# Patient Record
Sex: Female | Born: 1980 | Race: White | Hispanic: No | Marital: Married | Smoking: Current every day smoker
Health system: Southern US, Community
[De-identification: ages and names within clinical notes are randomized; demographics above are authoritative.]

## PROBLEM LIST (undated history)

## (undated) DIAGNOSIS — M1990 Unspecified osteoarthritis, unspecified site: Secondary | ICD-10-CM

## (undated) DIAGNOSIS — E079 Disorder of thyroid, unspecified: Secondary | ICD-10-CM

---

## 2001-05-08 ENCOUNTER — Emergency Department (HOSPITAL_COMMUNITY): Admission: EM | Admit: 2001-05-08 | Discharge: 2001-05-08 | Payer: Self-pay | Admitting: Emergency Medicine

## 2001-07-20 ENCOUNTER — Emergency Department (HOSPITAL_COMMUNITY): Admission: EM | Admit: 2001-07-20 | Discharge: 2001-07-20 | Payer: Self-pay | Admitting: Emergency Medicine

## 2001-12-27 ENCOUNTER — Emergency Department (HOSPITAL_COMMUNITY): Admission: EM | Admit: 2001-12-27 | Discharge: 2001-12-27 | Payer: Self-pay | Admitting: Emergency Medicine

## 2003-06-12 ENCOUNTER — Emergency Department (HOSPITAL_COMMUNITY): Admission: EM | Admit: 2003-06-12 | Discharge: 2003-06-12 | Payer: Self-pay | Admitting: Emergency Medicine

## 2003-07-18 ENCOUNTER — Ambulatory Visit (HOSPITAL_COMMUNITY): Admission: RE | Admit: 2003-07-18 | Discharge: 2003-07-18 | Payer: Self-pay | Admitting: *Deleted

## 2003-12-09 ENCOUNTER — Inpatient Hospital Stay (HOSPITAL_COMMUNITY): Admission: RE | Admit: 2003-12-09 | Discharge: 2003-12-12 | Payer: Self-pay | Admitting: *Deleted

## 2005-01-31 ENCOUNTER — Emergency Department (HOSPITAL_COMMUNITY): Admission: EM | Admit: 2005-01-31 | Discharge: 2005-01-31 | Payer: Self-pay | Admitting: Emergency Medicine

## 2005-05-13 ENCOUNTER — Ambulatory Visit: Payer: Self-pay | Admitting: Family Medicine

## 2005-05-14 ENCOUNTER — Ambulatory Visit (HOSPITAL_COMMUNITY): Admission: RE | Admit: 2005-05-14 | Discharge: 2005-05-14 | Payer: Self-pay | Admitting: Family Medicine

## 2005-06-06 ENCOUNTER — Emergency Department (HOSPITAL_COMMUNITY): Admission: EM | Admit: 2005-06-06 | Discharge: 2005-06-06 | Payer: Self-pay | Admitting: Emergency Medicine

## 2005-06-17 ENCOUNTER — Ambulatory Visit: Payer: Self-pay | Admitting: Family Medicine

## 2005-10-13 ENCOUNTER — Ambulatory Visit: Payer: Self-pay | Admitting: Family Medicine

## 2005-12-16 ENCOUNTER — Ambulatory Visit: Payer: Self-pay | Admitting: Family Medicine

## 2005-12-22 ENCOUNTER — Ambulatory Visit: Payer: Self-pay | Admitting: Family Medicine

## 2006-01-12 ENCOUNTER — Emergency Department (HOSPITAL_COMMUNITY): Admission: EM | Admit: 2006-01-12 | Discharge: 2006-01-12 | Payer: Self-pay | Admitting: Emergency Medicine

## 2006-01-24 ENCOUNTER — Encounter: Payer: Self-pay | Admitting: Family Medicine

## 2006-01-24 ENCOUNTER — Other Ambulatory Visit: Admission: RE | Admit: 2006-01-24 | Discharge: 2006-01-24 | Payer: Self-pay | Admitting: Family Medicine

## 2006-01-24 ENCOUNTER — Ambulatory Visit: Payer: Self-pay | Admitting: Family Medicine

## 2006-01-24 LAB — CONVERTED CEMR LAB: Pap Smear: NORMAL

## 2006-04-14 ENCOUNTER — Ambulatory Visit: Payer: Self-pay | Admitting: Family Medicine

## 2006-05-07 ENCOUNTER — Emergency Department (HOSPITAL_COMMUNITY): Admission: EM | Admit: 2006-05-07 | Discharge: 2006-05-08 | Payer: Self-pay | Admitting: Emergency Medicine

## 2006-05-08 ENCOUNTER — Emergency Department (HOSPITAL_COMMUNITY): Admission: EM | Admit: 2006-05-08 | Discharge: 2006-05-08 | Payer: Self-pay | Admitting: Emergency Medicine

## 2006-05-17 ENCOUNTER — Ambulatory Visit: Payer: Self-pay | Admitting: Family Medicine

## 2006-06-08 ENCOUNTER — Emergency Department (HOSPITAL_COMMUNITY): Admission: EM | Admit: 2006-06-08 | Discharge: 2006-06-08 | Payer: Self-pay | Admitting: Emergency Medicine

## 2006-06-09 ENCOUNTER — Emergency Department (HOSPITAL_COMMUNITY): Admission: EM | Admit: 2006-06-09 | Discharge: 2006-06-09 | Payer: Self-pay | Admitting: Emergency Medicine

## 2006-08-10 ENCOUNTER — Emergency Department (HOSPITAL_COMMUNITY): Admission: EM | Admit: 2006-08-10 | Discharge: 2006-08-10 | Payer: Self-pay | Admitting: Emergency Medicine

## 2006-08-10 ENCOUNTER — Ambulatory Visit: Payer: Self-pay | Admitting: Family Medicine

## 2007-02-06 ENCOUNTER — Emergency Department (HOSPITAL_COMMUNITY): Admission: EM | Admit: 2007-02-06 | Discharge: 2007-02-06 | Payer: Self-pay | Admitting: *Deleted

## 2007-03-09 ENCOUNTER — Encounter: Payer: Self-pay | Admitting: Family Medicine

## 2007-04-11 ENCOUNTER — Emergency Department (HOSPITAL_COMMUNITY): Admission: EM | Admit: 2007-04-11 | Discharge: 2007-04-11 | Payer: Self-pay | Admitting: Emergency Medicine

## 2007-04-17 ENCOUNTER — Encounter: Payer: Self-pay | Admitting: Family Medicine

## 2007-04-17 DIAGNOSIS — F329 Major depressive disorder, single episode, unspecified: Secondary | ICD-10-CM

## 2007-04-17 DIAGNOSIS — J45909 Unspecified asthma, uncomplicated: Secondary | ICD-10-CM | POA: Insufficient documentation

## 2007-04-17 DIAGNOSIS — L02519 Cutaneous abscess of unspecified hand: Secondary | ICD-10-CM

## 2007-04-17 DIAGNOSIS — L03019 Cellulitis of unspecified finger: Secondary | ICD-10-CM

## 2007-04-17 DIAGNOSIS — E669 Obesity, unspecified: Secondary | ICD-10-CM

## 2008-02-08 ENCOUNTER — Emergency Department (HOSPITAL_COMMUNITY): Admission: EM | Admit: 2008-02-08 | Discharge: 2008-02-08 | Payer: Self-pay | Admitting: Emergency Medicine

## 2009-04-26 ENCOUNTER — Emergency Department (HOSPITAL_COMMUNITY): Admission: EM | Admit: 2009-04-26 | Discharge: 2009-04-27 | Payer: Self-pay | Admitting: Emergency Medicine

## 2009-08-18 ENCOUNTER — Emergency Department (HOSPITAL_COMMUNITY): Admission: EM | Admit: 2009-08-18 | Discharge: 2009-08-18 | Payer: Self-pay | Admitting: Emergency Medicine

## 2010-04-07 NOTE — Letter (Signed)
Summary: RPC chart  RPC chart   Imported By: Curtis Sites 09/25/2009 12:52:44  _____________________________________________________________________  External Attachment:    Type:   Image     Comment:   External Document

## 2010-04-19 ENCOUNTER — Emergency Department: Payer: Self-pay | Admitting: Emergency Medicine

## 2010-05-22 ENCOUNTER — Emergency Department (HOSPITAL_COMMUNITY)
Admission: EM | Admit: 2010-05-22 | Discharge: 2010-05-22 | Disposition: A | Discharge status: Home / Self Care | Payer: Medicaid Other | Attending: Emergency Medicine | Admitting: Emergency Medicine

## 2010-05-22 DIAGNOSIS — K2900 Acute gastritis without bleeding: Secondary | ICD-10-CM | POA: Insufficient documentation

## 2010-05-22 DIAGNOSIS — Z331 Pregnant state, incidental: Secondary | ICD-10-CM | POA: Insufficient documentation

## 2010-05-22 DIAGNOSIS — R109 Unspecified abdominal pain: Secondary | ICD-10-CM | POA: Insufficient documentation

## 2010-05-22 LAB — URINALYSIS, ROUTINE W REFLEX MICROSCOPIC
Bilirubin Urine: NEGATIVE
Leukocytes, UA: NEGATIVE
Nitrite: NEGATIVE
Specific Gravity, Urine: 1.015 (ref 1.005–1.030)
Urobilinogen, UA: 0.2 mg/dL (ref 0.0–1.0)
pH: 7 (ref 5.0–8.0)

## 2010-05-22 LAB — URINE MICROSCOPIC-ADD ON

## 2010-05-23 LAB — URINE CULTURE: Colony Count: 4000

## 2010-05-27 LAB — RAPID STREP SCREEN (MED CTR MEBANE ONLY): Streptococcus, Group A Screen (Direct): POSITIVE — AB

## 2010-07-24 NOTE — Discharge Summary (Signed)
NAME:  [name], Lauren Parks               ACCOUNT NO.:  [id number]   MEDICAL RECORD NO.:  [id number]          PATIENT TYPE:  INP   LOCATION:  A412                          FACILITY:  [facility]   PHYSICIAN:  Langley Gauss, M.D.   DATE OF BIRTH:  1980-10-07   DATE OF ADMISSION:  12/09/2003  DATE OF DISCHARGE:  10/06/2005LH                                 DISCHARGE SUMMARY   PROCEDURE:  Primary low-transverse cesarean section December 09, 2003.  Delivery of a 4 pound, 14 ounce female infant.   POSTOPERATIVE DIAGNOSES:  1.  Anemia secondary to blood loss.  2.  Postoperative ileus extending the hospital stay to October 6.   HOSPITAL COURSE:  See previous dictation.  The patient presented on December 09, 2003, 38+ weeks' gestation, after an uncomplicated pregnancy.  The  patient was noted to have variable decelerations during the immediate  observation period.  Thus, preparations are made to proceed with emergent,  primary low-transverse cesarean section.  This was performed without  complications.  Noted at the time of delivery is green, thick meconium  amniotic fluid as well as a nuchal cord x1.  The patient had a Foley  catheter in place, and a JP drain within the subcutaneous space.  She did  very well pain relief, receiving IV Buprenex.   On postoperative day #1, Foley catheter was removed.  The patient  subsequently was fully ambulatory.  Vital signs remained stable.  She had  good urine output.  JP drain continued to drain.   On postoperative day #2, the patient began having some gas pains but had not  passed any flatus.  She again increased her ambulation, voided without  difficulty.  JP drain was aspirated with fluid noted to be clearing.  Abdomen was soft and nontender.  The patient was given a Dulcolax  suppository.   On postoperative day #3, the patient at that time had the JP drain removed.  The incision was examined and noted to well approximated, with minimal  erythema and no  significant induration noted.   FOLLOWUP:  Thus, the patient was discharged home on December 12, 2003, with  instructions to follow up in the office in four days' time for staple  removal.   DISCHARGE INSTRUCTIONS:  She was given a copy of standardized discharge  instructions at the time of discharge.  The retention sutures were also  removed at the time of discharge.   DISCHARGE MEDICATIONS:  Tylox #30 with no refill.   LABORATORY DATA:  Admission hemoglobin and hematocrit 13.3/39.3 with a white  count of 9.0.  On postoperative day #1, hemoglobin 11.3, hematocrit 32.0  with a white count of 10.5.  Postoperative day #3, hemoglobin had  equilibrated to 9.6/28.1 with a white count of 6.0.   INFANT DISCHARGE CONDITION:  The patient is bottle feeding at the time of  discharge.  Pediatric care by Dr. Ara Kussmaul.      DC/MEDQ  D:  12/15/2003  T:  12/15/2003  Job:  [id number]   cc:   Francoise Schaumann. Halm, D.O.  [address]    [place] [address]  Fax: [phone]

## 2010-07-24 NOTE — H&P (Signed)
NAME:  CAMRY, ROBELLO               ACCOUNT NO.:  [id number]   MEDICAL RECORD NO.:  [id number]          PATIENT TYPE:  INP   LOCATION:  LDR4                          FACILITY:  [facility]   PHYSICIAN:  Langley Gauss, M.D.   DATE OF BIRTH:  [birth year]   DATE OF ADMISSION:  12/09/2003  DATE OF DISCHARGE:  LH                                HISTORY & PHYSICAL   A 30 year old gravida 1, para 0, at 38+ weeks' gestation, who presents at  0900 complaining of uterine contractions onset 0600.  Upon initial  presentation, the patient is noted to have fetal heart rate decelerations to  60-70 beats per minute x30 seconds' duration with each uterine contraction.  The patient's prenatal course has been uncomplicated.  She did have an  abnormal one-hour GTT, but a three-hour GTT was within normal limits.  She  has had appropriate fundal height growth and a normal anatomic survey on  previous ultrasonographic study.   PAST MEDICAL HISTORY:  She was evaluated during the pregnancy for signs and  symptoms of hypoglycemia.  She was seen by Dr. Gerilyn Pilgrim.  Workup was  essentially negative.  The patient did well with changing her diet to avoid  sugar-containing foods and liquids.   CURRENT MEDICATIONS:  The patient does have a history of asthma.  During the  pregnancy she has used albuterol and Flovent inhalers as well as Allegra D  on a p.r.n. basis.   She has no known drug allergies.   PHYSICAL EXAMINATION:  VITAL SIGNS:  Height 5 feet 1-1/2 inches, blood  pressure 145/90, pulse 80, respiratory rate 20.  HEENT:  Negative.  No adenopathy.  NECK:  Supple.  Thyroid is nonpalpable.  CHEST:  Lungs clear.  CARDIOVASCULAR:  Regular rate and rhythm.  ABDOMEN:  Soft and nontender.  No surgical scars were identified.  Vertex  presentation by Leopold's maneuvers.  Fundal height is 36 cm.  Fetal heart  tones auscultated in the 150s.  EXTREMITIES:  Noted to be normal.  PELVIC:  Normal external genitalia.  No lesions  or ulcerations.  No vaginal  bleeding, no leakage of fluid.  Cervix 1 cm dilated, posterior, minimally  effaced.   LABORATORY DATA:  Her group B strep is negative.  Three-hour GTT as stated  previously, normal.   External fetal monitor reveals normal long-term variability, but there are  the fetal heart decelerations described previously, which undoubtedly  represent cord compression associated with uterine contractions.  The  patient is treated with 0.25 mg of subcu terbutaline to result in cessation  of uterine activity, and the decision is made to proceed with emergent low  transverse cesarean section.  Following administration of terbutaline and  cessation of uterine activity, there was no further fetal heart  decelerations.  However, as they would be expected to recur with renewed  uterine activity, a decision has been made to proceed with primary low  transverse cesarean section with no  fetal heart rate decelerations present at this time.  The patient can  probably be treated with a spinal analgesic.  Called the O.R.  Cases  are  currently in process.  Expectation is a room will be available within 30  minutes with the status of the fetal heart rate at present with no further  heart rate decelerations.  This is a viable option.      DC/MEDQ  D:  12/09/2003  T:  12/09/2003  Job:  [id number]

## 2010-07-24 NOTE — Op Note (Signed)
NAME:  Lauren, Parks               ACCOUNT NO.:  [id number]   MEDICAL RECORD NO.:  [id number]          PATIENT TYPE:  INP   LOCATION:  LDR4                          FACILITY:  [facility]   PHYSICIAN:  Langley Gauss, M.D.   DATE OF BIRTH:  04-Nov-1980   DATE OF PROCEDURE:  12/09/2003  DATE OF DISCHARGE:                                 OPERATIVE REPORT   PREOPERATIVE DIAGNOSES:  1.  Thirty-eight plus week intrauterine pregnancy.  2.  Moderate fetal heart rate variable-type decelerations.  3.  Intrauterine growth restriction.   PROCEDURE PERFORMED:  Emergent primary low transverse cesarean section,  delivery of a 4 pound 11 ounce female infant.   SURGEON:  Langley Gauss, M.D.   SPECIMENS:  Arterial cord gas and cord blood are obtained and sent to the  laboratory.  The placenta is likewise sent to pathology.  It is noted to be  a small placenta.   ESTIMATED BLOOD LOSS:  800 mL.   ANALGESIC:  Spinal.   PEDIATRICIAN:  Francoise Schaumann. Halm, D.O.   DRAINS:  Foley catheter to straight drainage, a JP drain is placed in the  subcutaneous space.   Findings at time of surgery include green, meconium-stained amniotic fluid.  Also noted is a nuchal cord x1 with thin umbilical cord with decreased  Wharton's jelly identified.   SUMMARY:  See previous H&P.  The patient was noted to have no further fetal  heart rate decelerations with complete cessation of uterine activity.  She  was thus taken to the operating room, where placed in a seated position a  spinal analgesic was administered without complications.  The patient was  then returned to the operating room table with a left lateral tilt, prepped  and draped in the usual sterile manner, a Foley catheter then placed on the  fourth floor.  After assurance of adequate surgical analgesia, a knife is  used to create a Pfannenstiel incision.  Multiple subcutaneous bleeders were  cauterized to achieve hemostasis, dissected down to the fascial plane.   The  fascia is then incised in a transverse curvilinear manner while sharply  dissected off the underlying rectus muscle in the avascular plane.  The  fascial edges are grasped using straight Kocher clamps.  The fascia is  dissected off the underlying rectus muscle in the midline utilizing the Mayo  scissors.  The rectus muscles were then bluntly separated.  The peritoneal  cavity is entered by elevating the peritoneum with an Allis clamp and then  sharp and fine dissection with the Mayo scissors to atraumatically enter the  peritoneal cavity.  Peritoneal incision is then extended superiorly and  inferiorly and freely palpated to visualize the bladder to avoid its  accidental entry.  The bladder blade is then placed, the lower uterine  segment is identified, and a bladder flap is created from the vesicouterine  fold in the avascular plane.  This is bluntly pushed down toward the cervix.  The low transverse uterine incision is thus scored with a knife, intact  amniotic sac is encountered in the midline.  Amniotic sac is artificially  ruptured with an Allis clamp with the findings of the green, meconium-  stained amniotic fluid.  My index fingers are then used to bluntly extend  the uterine incision bilaterally.  Infant is noted to be in an LOA position.  The head of the infant is flexed and elevated to the level of the uterine  incision.  The disposable suction connected to wall suction is then placed  on the infant's vertex.  Gentle traction combined with fundal pressure  results in very easy delivery of the head through the uterine incision.  Mouth and nares were aggressively bulb-suctioned of the meconium-stained  amniotic fluid.  A nuchal cord x1 is reduced.  Renewed fundal pressure plus  gentle traction results in delivery of the anterior right shoulder without  difficulty as well as the remainder of the infant without difficulty.  The  umbilical cord is then milked toward the infant,  the cord is doubly clamped  and cut, and the infant is taken to the waiting pediatrician, Francoise Schaumann.  Halm, D.O.  Arterial cord gas and cord blood are then obtained from the  umbilical cord.  Gentle traction on the umbilical cord results in a  separation, which upon examination appears to be an intact placenta with  associated three-vessel umbilical cord.  The uterus is then exteriorized.  Intrauterine exploration reveals no retained placental fragments.  The  uterine is not extended.  It is easily closed with 0 chromic in a running  locked fashion, second layer being an imbricating layer.  This results in  excellent hemostasis.  Tubes and ovaries noted to be normal in appearance.  The cul-de-sac is irrigated free of all clots, uterus returned to the pelvic  cavity.  The peritoneal edges are grasped using Kelly clamps.  Sponge,  needle, and instrument counts are correct x2 at this point.  The gutters are  then well-irrigated until clear.  The peritoneum and overlying rectus  muscles are then closed with a 0 chromic running suture.  No subfascial  bleeders are cauterized or bleeding noted.  Thus, the fascia is closed with  a continuous running #1 PDS suture.  A JP drain is placed in the  subcutaneous space with a separate exist wound to the left inferiormost  portion of the incision.  This is sutured into place with a 3-0 Ethilon  suture.  Three horizontal mattress sutures of #1 PDS suture are then placed  as retention-type sutures, and the skin is completely closed utilizing skin  staples.  A total of 30 mL of 0.5% bupivacaine plain is injected along the  entirety of the skin incision through a small skin wheal.  The patient  tolerated the procedure very well.  She continues to drain clear yellow  urine.  Vital signs have remained stable.  She is taken to the recovery room  in stable condition.  She does plan on bottle-feeding.     DC/MEDQ  D:  12/09/2003  T:  12/09/2003  Job:  [id number]

## 2010-12-24 LAB — CBC
HCT: 38.1
Hemoglobin: 13.6
MCHC: 35.8
MCV: 83.2
Platelets: 188
RBC: 4.58
RDW: 12.2
WBC: 8.3

## 2010-12-24 LAB — URINALYSIS, ROUTINE W REFLEX MICROSCOPIC
Bilirubin Urine: NEGATIVE
Glucose, UA: NEGATIVE
Ketones, ur: NEGATIVE
Leukocytes, UA: NEGATIVE
Nitrite: NEGATIVE
Protein, ur: NEGATIVE
Specific Gravity, Urine: >1.03 — ABNORMAL HIGH
Urobilinogen, UA: 0.2
pH: 5.5

## 2010-12-24 LAB — COMPREHENSIVE METABOLIC PANEL
ALT: 18
AST: 25
Albumin: 4.2
Alkaline Phosphatase: 61
BUN: 13
CO2: 21
Calcium: 9.5
Chloride: 105
Creatinine, Ser: 0.61
GFR calc Af Amer: >60
GFR calc non Af Amer: >60
Glucose, Bld: 95
Potassium: 4.3
Sodium: 137
Total Bilirubin: 0.5
Total Protein: 6.7

## 2010-12-24 LAB — WET PREP, GENITAL
Trich, Wet Prep: NONE SEEN
WBC, Wet Prep HPF POC: NONE SEEN
Yeast Wet Prep HPF POC: NONE SEEN

## 2010-12-24 LAB — DIFFERENTIAL
Basophils Absolute: 0.1
Basophils Relative: 1
Eosinophils Absolute: 0
Eosinophils Relative: 1
Lymphocytes Relative: 22
Lymphs Abs: 1.8
Monocytes Absolute: 0.5
Monocytes Relative: 7
Neutro Abs: 5.8
Neutrophils Relative %: 70

## 2010-12-24 LAB — URINE MICROSCOPIC-ADD ON

## 2010-12-24 LAB — PREGNANCY, URINE: Preg Test, Ur: NEGATIVE

## 2010-12-24 LAB — RPR: RPR Ser Ql: NONREACTIVE

## 2010-12-24 LAB — GC/CHLAMYDIA PROBE AMP, GENITAL
Chlamydia, DNA Probe: NEGATIVE
GC Probe Amp, Genital: NEGATIVE

## 2010-12-24 LAB — LIPASE, BLOOD: Lipase: 21

## 2011-03-09 HISTORY — PX: TUBAL LIGATION: SHX77

## 2013-11-01 ENCOUNTER — Ambulatory Visit: Payer: Self-pay | Admitting: Family Medicine

## 2015-08-15 ENCOUNTER — Ambulatory Visit: Payer: Self-pay | Admitting: Sports Medicine

## 2016-05-01 ENCOUNTER — Emergency Department (HOSPITAL_COMMUNITY)
Admission: EM | Admit: 2016-05-01 | Discharge: 2016-05-01 | Disposition: A | Discharge status: Home / Self Care | Payer: BLUE CROSS/BLUE SHIELD | Attending: Emergency Medicine | Admitting: Emergency Medicine

## 2016-05-01 ENCOUNTER — Emergency Department (HOSPITAL_COMMUNITY): Payer: BLUE CROSS/BLUE SHIELD

## 2016-05-01 ENCOUNTER — Encounter (HOSPITAL_COMMUNITY): Payer: Self-pay | Admitting: Emergency Medicine

## 2016-05-01 DIAGNOSIS — J45909 Unspecified asthma, uncomplicated: Secondary | ICD-10-CM | POA: Insufficient documentation

## 2016-05-01 DIAGNOSIS — Y939 Activity, unspecified: Secondary | ICD-10-CM | POA: Diagnosis not present

## 2016-05-01 DIAGNOSIS — S3992XA Unspecified injury of lower back, initial encounter: Secondary | ICD-10-CM | POA: Diagnosis present

## 2016-05-01 DIAGNOSIS — Y929 Unspecified place or not applicable: Secondary | ICD-10-CM | POA: Diagnosis not present

## 2016-05-01 DIAGNOSIS — Y999 Unspecified external cause status: Secondary | ICD-10-CM | POA: Diagnosis not present

## 2016-05-01 DIAGNOSIS — S39012A Strain of muscle, fascia and tendon of lower back, initial encounter: Secondary | ICD-10-CM | POA: Diagnosis not present

## 2016-05-01 DIAGNOSIS — X58XXXA Exposure to other specified factors, initial encounter: Secondary | ICD-10-CM | POA: Insufficient documentation

## 2016-05-01 DIAGNOSIS — F17210 Nicotine dependence, cigarettes, uncomplicated: Secondary | ICD-10-CM | POA: Insufficient documentation

## 2016-05-01 HISTORY — DX: Disorder of thyroid, unspecified: E07.9

## 2016-05-01 HISTORY — DX: Unspecified osteoarthritis, unspecified site: M19.90

## 2016-05-01 LAB — URINALYSIS, ROUTINE W REFLEX MICROSCOPIC
Bilirubin Urine: NEGATIVE
GLUCOSE, UA: NEGATIVE mg/dL
KETONES UR: NEGATIVE mg/dL
LEUKOCYTES UA: NEGATIVE
Nitrite: NEGATIVE
Protein, ur: NEGATIVE mg/dL
Specific Gravity, Urine: 1.025 (ref 1.005–1.030)
pH: 5 (ref 5.0–8.0)

## 2016-05-01 LAB — POC URINE PREG, ED: Preg Test, Ur: NEGATIVE

## 2016-05-01 MED ORDER — TRAMADOL HCL 50 MG PO TABS: 50.0000 mg | ORAL_TABLET | Freq: Four times a day (QID) | ORAL | 0 refills | Status: AC | PRN

## 2016-05-01 MED ORDER — DIAZEPAM 5 MG PO TABS
10.0000 mg | ORAL_TABLET | Freq: Once | ORAL | Status: AC
  Administered 2016-05-01: 10 mg via ORAL
  Filled 2016-05-01: qty 2

## 2016-05-01 MED ORDER — ONDANSETRON HCL 4 MG PO TABS
4.0000 mg | ORAL_TABLET | Freq: Once | ORAL | Status: AC
  Administered 2016-05-01: 4 mg via ORAL
  Filled 2016-05-01: qty 1

## 2016-05-01 MED ORDER — IBUPROFEN 600 MG PO TABS: 600.0000 mg | ORAL_TABLET | Freq: Four times a day (QID) | ORAL | 0 refills | Status: AC | PRN

## 2016-05-01 MED ORDER — TRAMADOL HCL 50 MG PO TABS
100.0000 mg | ORAL_TABLET | Freq: Once | ORAL | Status: AC
  Administered 2016-05-01: 100 mg via ORAL
  Filled 2016-05-01: qty 2

## 2016-05-01 NOTE — ED Provider Notes (Signed)
[facility]-EMERGENCY DEPT Provider Note   CSN: [id number] Arrival date & time: 05/01/16  1355     History   Chief Complaint Chief Complaint  Patient presents with  . Back Pain    HPI Lauren Parks Bridge is a 36 y.o. female.  Patient is a 36 year old female who presents to the emergency department with a complaint of lower back pain.  The patient states that she started having back pain about 6 days ago. She states that she has to do a lot of standing on her job, she works 12 hour shifts. She does have some movement that involves bending and using the muscles of her back, but she states she's been doing the same job for several months, and has not had problems in the past. She denies any injury or trauma. She denies any operations or procedures involving her lower back. She denies numbness and tingling of the lower extremities. There's been no numbness on the inner thigh or the genital areas on. She has not had any loss of bowel or bladder function. The patient reports that she has more pain with movement. But she still has some pain at rest. She presents to the emergency department at this time for additional evaluation of this pain.   The history is provided by the patient.  Back Pain   Pertinent negatives include no chest pain, no abdominal pain and no dysuria.    Past Medical History:  Diagnosis Date  . Arthritis    rheumatoid   . Thyroid disease    hypothyroid    Patient Active Problem List   Diagnosis Date Noted  . OBESITY 04/17/2007  . DEPRESSION 04/17/2007  . ASTHMA 04/17/2007  . CELLULITIS, FINGER 04/17/2007    Past Surgical History:  Procedure Laterality Date  . CESAREAN SECTION      OB History    No data available       Home Medications    Prior to Admission medications   Not on File    Family History No family history on file.  Social History Social History  Substance Use Topics  . Smoking status: Current Every Day Smoker    Packs/day: 0.50   Types: Cigarettes  . Smokeless tobacco: Never Used  . Alcohol use No     Allergies   Morphine and related   Review of Systems Review of Systems  Constitutional: Negative for activity change.       All ROS Neg except as noted in HPI  HENT: Negative for nosebleeds.   Eyes: Negative for photophobia and discharge.  Respiratory: Negative for cough, shortness of breath and wheezing.   Cardiovascular: Negative for chest pain and palpitations.  Gastrointestinal: Negative for abdominal pain and blood in stool.  Genitourinary: Negative for dysuria, enuresis, frequency and hematuria.  Musculoskeletal: Positive for back pain. Negative for arthralgias and neck pain.  Skin: Negative.   Neurological: Negative for dizziness, seizures and speech difficulty.  Psychiatric/Behavioral: Negative for confusion and hallucinations.  All other systems reviewed and are negative.    Physical Exam Updated Vital Signs BP 121/79 (BP Location: Left Arm)   Pulse 76   Temp 99 F (37.2 C) (Oral)   Resp 16   Ht 5\' 2"  (1.575 m)   Wt 78.9 kg   LMP 04/15/2016   SpO2 100%   BMI 31.83 kg/m   Physical Exam  Constitutional: She is oriented to person, place, and time. She appears well-developed and well-nourished.  Non-toxic appearance.  HENT:  Head: Normocephalic.  Right Ear: Tympanic membrane and external ear normal.  Left Ear: Tympanic membrane and external ear normal.  Eyes: EOM and lids are normal. Pupils are equal, round, and reactive to light.  Neck: Normal range of motion. Neck supple. Carotid bruit is not present.  Cardiovascular: Normal rate, regular rhythm, normal heart sounds, intact distal pulses and normal pulses.   Pulmonary/Chest: Breath sounds normal. No respiratory distress.  Abdominal: Soft. Bowel sounds are normal. There is no tenderness. There is no guarding.  Musculoskeletal:       Lumbar back: She exhibits decreased range of motion and pain.       Back:  Lymphadenopathy:        Head (right side): No submandibular adenopathy present.       Head (left side): No submandibular adenopathy present.    She has no cervical adenopathy.  Neurological: She is alert and oriented to person, place, and time. She has normal strength. No cranial nerve deficit or sensory deficit.  No motor or sensory deficit. No foot drop. Gait slow but steady.  Skin: Skin is warm and dry.  Psychiatric: She has a normal mood and affect. Her speech is normal.  Nursing note and vitals reviewed.    ED Treatments / Results  Labs (all labs ordered are listed, but only abnormal results are displayed) Labs Reviewed - No data to display  EKG  EKG Interpretation None       Radiology No results found.  Procedures Procedures (including critical care time)  Medications Ordered in ED Medications - No data to display   Initial Impression / Assessment and Plan / ED Course  I have reviewed the triage vital signs and the nursing notes.  Pertinent labs & imaging results that were available during my care of the patient were reviewed by Parks and considered in my medical decision making (see chart for details).     **I have reviewed nursing notes, vital signs, and all appropriate lab and imaging results for this patient.*  Final Clinical Impressions(s) / ED Diagnoses MDM I have reviewed previous xray studies. There are degenerative changes at the L2-L3 area. No gross neuro deficit. Will check UA and L spine. Pain medication given. Vital signs reviewed. The pregnancy test is negative. Urinalysis shows a hazy yellow specimen with a specific gravity of 1.025. There are moderate amount of blood present on the dipstick. Under the microscope there is 05 white cells and 0-5 red cells. X-ray of the lumbar spine is negative for acute problem. The vertebral heights are normal, the disc space are grossly preserved.  I suspect on muscle strain of the lower back. The patient is treated with Flexeril,  Decadron, and diclofenac. The patient will be seen by primary physician, or orthopedics if not improving.    Final diagnoses:  Strain of lumbar region, initial encounter    New Prescriptions New Prescriptions   No medications on file     Lauren Parks, Lauren Parks 05/01/16 2029    Lauren Memos, MD 05/01/16 2036

## 2016-05-01 NOTE — ED Triage Notes (Signed)
Pt reports persistent back pain without injury x 6 days. States she went to PCP and was prescribed Prednisone and Flexeril with no relief. Pt ambulatory, but reports worsening pain with movement.

## 2016-05-01 NOTE — Discharge Instructions (Signed)
The x-ray of her lower back is negative for fracture or dislocation. The disc spaces are well preserved. Your examination suggests muscle strain involving the lower back. Please continue your steroid and your muscle relaxant. Please add 600 mg of ibuprofen with breakfast, lunch, dinner, and at bedtime. May use Ultram for more severe pain.This medication may cause drowsiness. Please do not drink, drive, or participate in activity that requires concentration while taking this medication. Please see Dr. Lodema HongSimpson for additional evaluation if not improving.

## 2018-05-25 IMAGING — DX DG LUMBAR SPINE COMPLETE 4+V
5 series · 5 of 5 positions shown · non-contrast
Comparison: None.

CLINICAL DATA: Low back pain for 1 week

EXAM:
LUMBAR SPINE - COMPLETE 4+ VIEW

[l-spine ap]
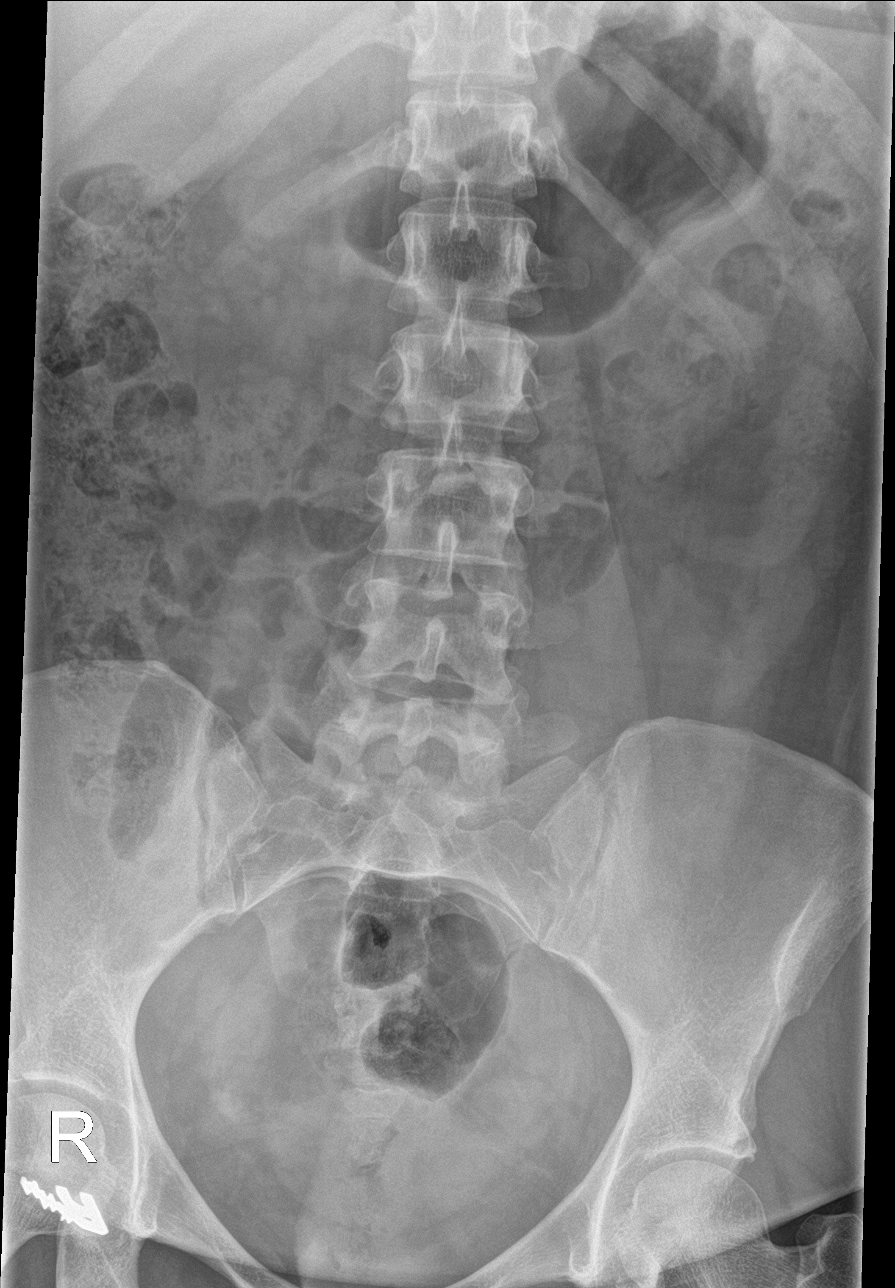

[l-spine obl (1 of 2)]
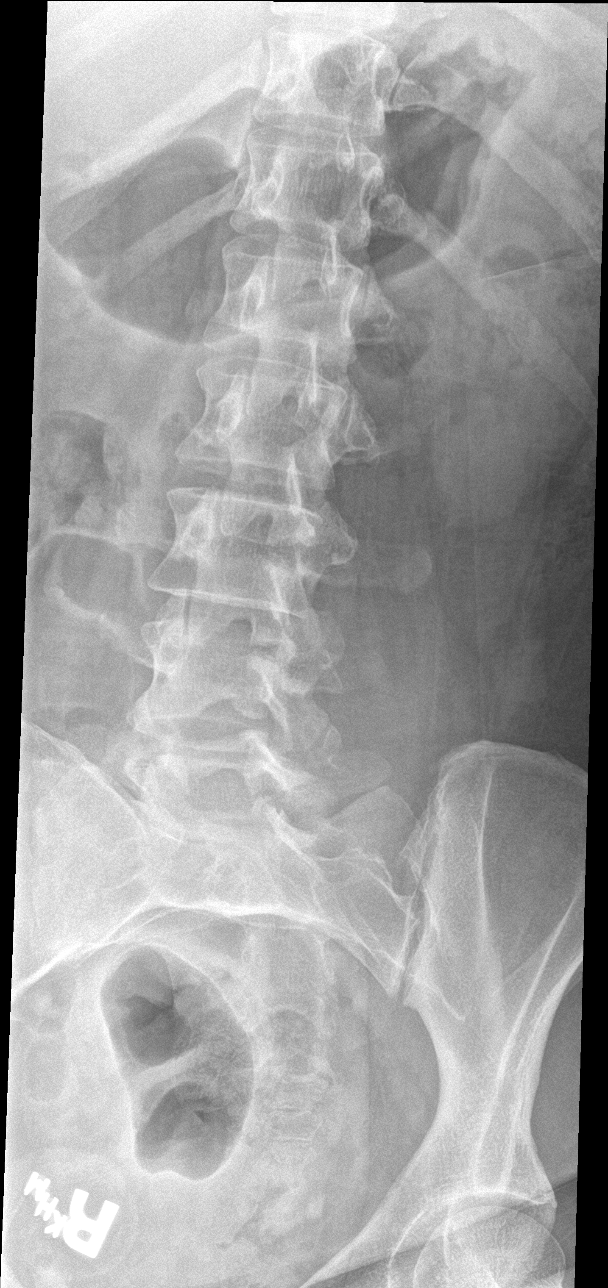

[l-spine obl (2 of 2)]
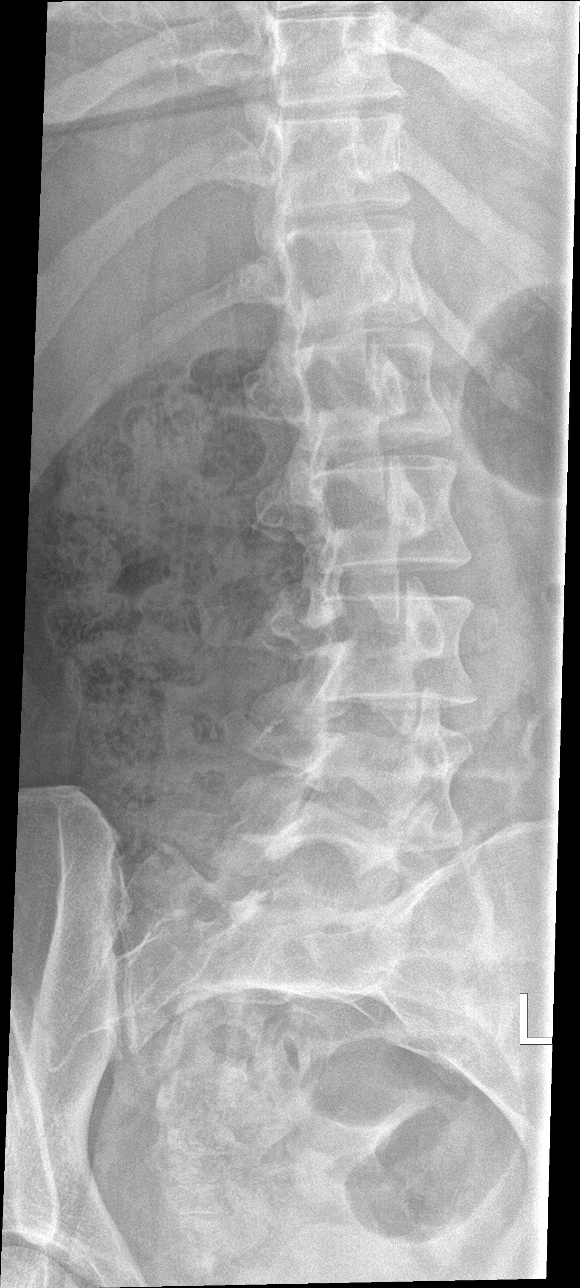

[l-spine lat]
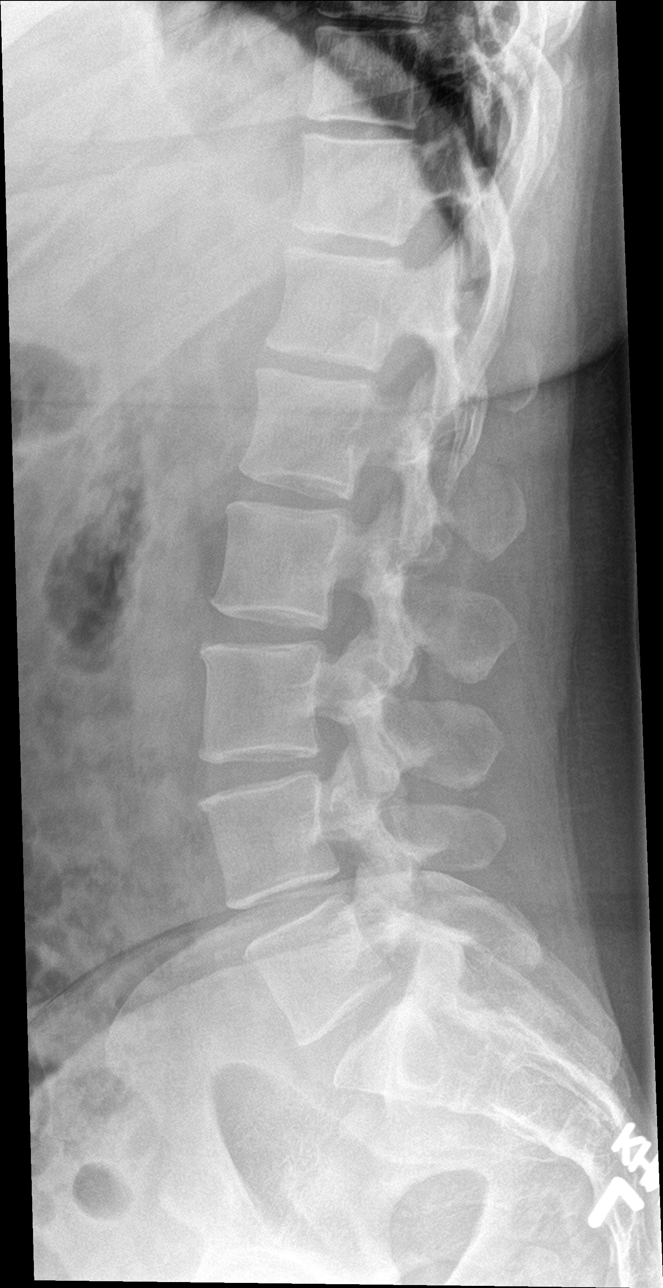

[l-spine spot]
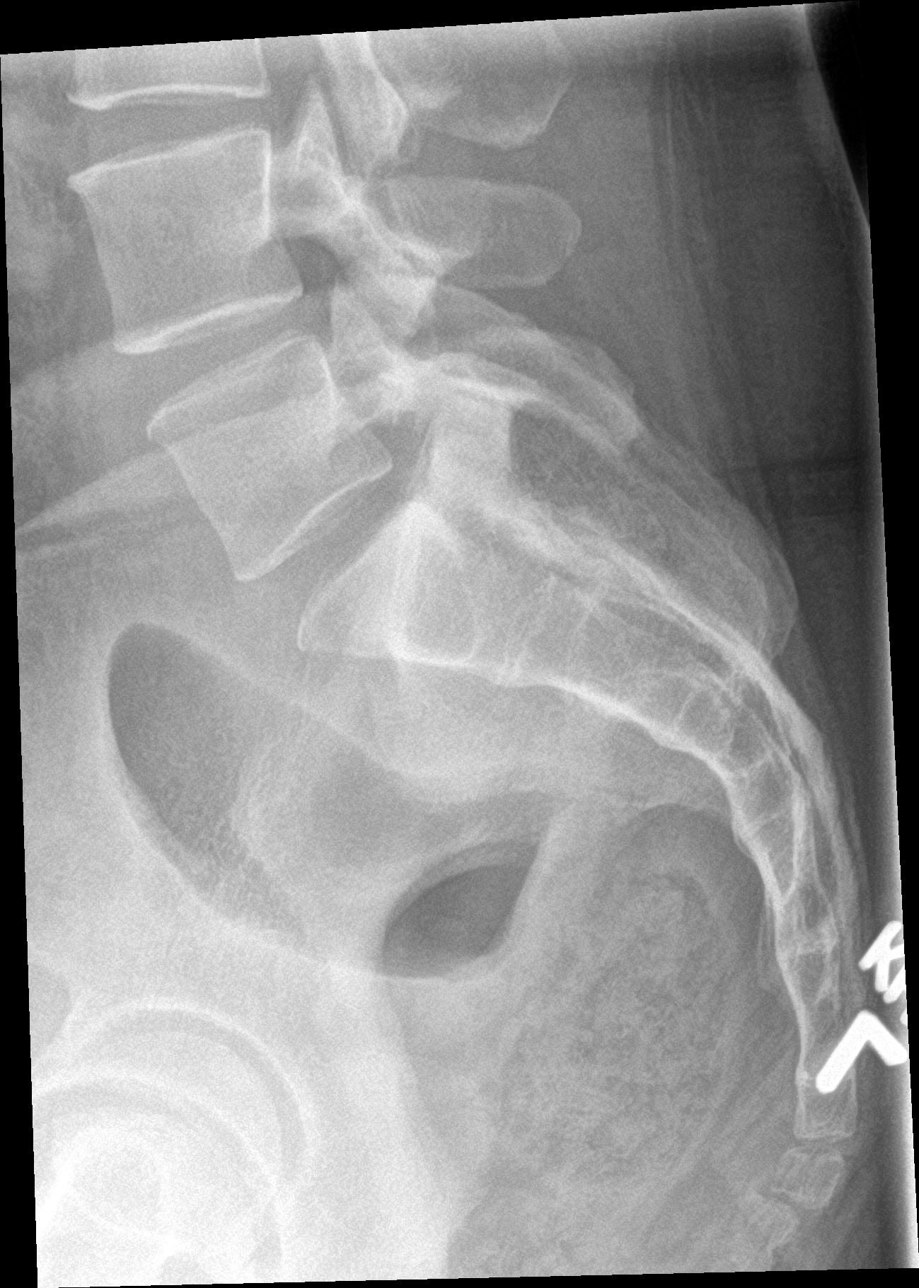

[5 of 5 positions shown; findings below may reference images not displayed]

FINDINGS: Five non rib-bearing lumbar type vertebra. The SI joints are patent.
Lumbar alignment within normal limits. Vertebral body heights are
normal. Disc spaces are grossly preserved.
IMPRESSION: Negative.

## 2018-07-18 ENCOUNTER — Other Ambulatory Visit: Payer: Self-pay

## 2018-07-18 ENCOUNTER — Encounter (HOSPITAL_COMMUNITY): Payer: Self-pay | Admitting: Emergency Medicine

## 2018-07-18 ENCOUNTER — Emergency Department (HOSPITAL_COMMUNITY)
Admission: EM | Admit: 2018-07-18 | Discharge: 2018-07-19 | Disposition: A | Discharge status: Home / Self Care | Payer: BLUE CROSS/BLUE SHIELD | Attending: Emergency Medicine | Admitting: Emergency Medicine

## 2018-07-18 DIAGNOSIS — K600 Acute anal fissure: Secondary | ICD-10-CM | POA: Diagnosis not present

## 2018-07-18 DIAGNOSIS — K5900 Constipation, unspecified: Secondary | ICD-10-CM | POA: Diagnosis not present

## 2018-07-18 DIAGNOSIS — K625 Hemorrhage of anus and rectum: Secondary | ICD-10-CM | POA: Diagnosis present

## 2018-07-18 DIAGNOSIS — K602 Anal fissure, unspecified: Secondary | ICD-10-CM

## 2018-07-18 LAB — CBC WITH DIFFERENTIAL/PLATELET
Abs Immature Granulocytes: 0.01 10*3/uL (ref 0.00–0.07)
Basophils Absolute: 0.1 10*3/uL (ref 0.0–0.1)
Basophils Relative: 1 %
Eosinophils Absolute: 0.5 10*3/uL (ref 0.0–0.5)
Eosinophils Relative: 6 %
HCT: 42 % (ref 36.0–46.0)
Hemoglobin: 13.7 g/dL (ref 12.0–15.0)
Immature Granulocytes: 0 %
Lymphocytes Relative: 35 %
Lymphs Abs: 3 10*3/uL (ref 0.7–4.0)
MCH: 29.7 pg (ref 26.0–34.0)
MCHC: 32.6 g/dL (ref 30.0–36.0)
MCV: 91.1 fL (ref 80.0–100.0)
Monocytes Absolute: 0.8 10*3/uL (ref 0.1–1.0)
Monocytes Relative: 9 %
Neutro Abs: 4.2 10*3/uL (ref 1.7–7.7)
Neutrophils Relative %: 49 %
Platelets: 199 10*3/uL (ref 150–400)
RBC: 4.61 MIL/uL (ref 3.87–5.11)
RDW: 12.3 % (ref 11.5–15.5)
WBC: 8.6 10*3/uL (ref 4.0–10.5)
nRBC: 0 % (ref 0.0–0.2)

## 2018-07-18 MED ORDER — DICYCLOMINE HCL 10 MG PO CAPS
10.0000 mg | ORAL_CAPSULE | Freq: Once | ORAL | Status: AC
  Administered 2018-07-18: 10 mg via ORAL
  Filled 2018-07-18: qty 1

## 2018-07-18 NOTE — ED Notes (Signed)
[facility] papers brought by [facility]

## 2018-07-18 NOTE — ED Triage Notes (Signed)
Pt reports she has had flare ups with IBS x 14 years, pt reports she began having mid abd pain last week, pt also reports bright red bloody stools x 1 episode last week and 1 episode again today, pt reports she has not been seen by PCP or GI

## 2018-07-18 NOTE — ED Notes (Signed)
Lab in room.

## 2018-07-19 LAB — BASIC METABOLIC PANEL
Anion gap: 8 (ref 5–15)
BUN: 14 mg/dL (ref 6–20)
CO2: 25 mmol/L (ref 22–32)
Calcium: 9 mg/dL (ref 8.9–10.3)
Chloride: 107 mmol/L (ref 98–111)
Creatinine, Ser: 0.74 mg/dL (ref 0.44–1.00)
GFR calc Af Amer: >60 mL/min (ref >60)
GFR calc non Af Amer: >60 mL/min (ref >60)
Glucose, Bld: 88 mg/dL (ref 70–99)
Potassium: 4.3 mmol/L (ref 3.5–5.1)
Sodium: 140 mmol/L (ref 135–145)

## 2018-07-19 LAB — URINALYSIS, ROUTINE W REFLEX MICROSCOPIC
Bacteria, UA: NONE SEEN
Bilirubin Urine: NEGATIVE
Glucose, UA: NEGATIVE mg/dL
Ketones, ur: NEGATIVE mg/dL
Leukocytes,Ua: NEGATIVE
Nitrite: NEGATIVE
Protein, ur: NEGATIVE mg/dL
Specific Gravity, Urine: 1.018 (ref 1.005–1.030)
pH: 7 (ref 5.0–8.0)

## 2018-07-19 LAB — TSH: TSH: 1.39 u[IU]/mL (ref 0.350–4.500)

## 2018-07-19 LAB — POC URINE PREG, ED: Preg Test, Ur: NEGATIVE

## 2018-07-19 MED ORDER — DICYCLOMINE HCL 20 MG PO TABS: 20.0000 mg | ORAL_TABLET | Freq: Two times a day (BID) | ORAL | 0 refills | Status: AC | PRN

## 2018-07-19 NOTE — Discharge Instructions (Addendum)

## 2018-07-19 NOTE — ED Provider Notes (Signed)
Emergency Department Provider Note   I have reviewed the triage vital signs and the nursing notes.   HISTORY  Chief Complaint Abdominal Pain   HPI Lauren Parks is a 38 y.o. female who is been having significant issues with constipation recently and specifically last couple weeks she has had episodes of constipation associated with some bright red blood in the stool, on the toilet paper and surrounding her feces.  She presents here for further evaluation.  No lightheadedness, fatigue or weakness.  Has had some weight gain recently secondary to not being at work and having significant change in diet also from not being at work.   No other associated or modifying symptoms.    Past Medical History:  Diagnosis Date  . Arthritis    rheumatoid   . Thyroid disease    hypothyroid    Patient Active Problem List   Diagnosis Date Noted  . OBESITY 04/17/2007  . DEPRESSION 04/17/2007  . ASTHMA 04/17/2007  . CELLULITIS, FINGER 04/17/2007    Past Surgical History:  Procedure Laterality Date  . CESAREAN SECTION    . TUBAL LIGATION Bilateral 2013    Current Outpatient Rx  . Order #: [id number] Class: Print  . Order #: [id number] Class: Print  . Order #: [id number] Class: Print    Allergies Morphine and related  History reviewed. No pertinent family history.  Social History Social History   Tobacco Use  . Smoking status: Current Every Day Smoker    Packs/day: 0.50    Types: Cigarettes  . Smokeless tobacco: Never Used  Substance Use Topics  . Alcohol use: No  . Drug use: No    Review of Systems  All other systems negative except as documented in the HPI. All pertinent positives and negatives as reviewed in the HPI. ____________________________________________   PHYSICAL EXAM:  VITAL SIGNS: ED Triage Vitals  Enc Vitals Group     BP 07/18/18 2217 (!) 137/105     Pulse Rate 07/18/18 2217 85     Resp 07/18/18 2217 17     Temp 07/18/18 2217 98.1 F (36.7 C)      Temp Source 07/18/18 2217 Oral     SpO2 07/18/18 2217 99 %     Weight 07/18/18 2218 160 lb (72.6 kg)     Height 07/18/18 2218 5\' 2"  (1.575 m)    Constitutional: Alert and oriented. Well appearing and in no acute distress. Eyes: Conjunctivae are normal. PERRL. EOMI. Head: Atraumatic. Nose: No congestion/rhinnorhea. Mouth/Throat: Mucous membranes are moist.  Oropharynx non-erythematous. Neck: No stridor.  No meningeal signs.   Cardiovascular: Normal rate, regular rhythm. Good peripheral circulation. Grossly normal heart sounds.   Respiratory: Normal respiratory effort.  No retractions. Lungs CTAB. Gastrointestinal: Soft and nontender. No distention.  GU: small anal fissure at approximately 1100 position. No active hemorrhage Musculoskeletal: No lower extremity tenderness nor edema. No gross deformities of extremities. Neurologic:  Normal speech and language. No gross focal neurologic deficits are appreciated.  Skin:  Skin is warm, dry and intact. No rash noted.   ____________________________________________   LABS (all labs ordered are listed, but only abnormal results are displayed)  Labs Reviewed  URINALYSIS, ROUTINE W REFLEX MICROSCOPIC - Abnormal; Notable for the following components:      Result Value   Hgb urine dipstick SMALL (*)    All other components within normal limits  CBC WITH DIFFERENTIAL/PLATELET  BASIC METABOLIC PANEL  TSH  POC URINE PREG, ED   ____________________________________________  INITIAL IMPRESSION / ASSESSMENT  AND PLAN / ED COURSE  Constipation likely secondary to change in diet dehydration.  Anal fissure likely related to the same.  Low suspicion for internal GI bleed or mechanical bowel obstruction at this time.  Stable for discharge with bowel cleanout instructions.   Pertinent labs & imaging results that were available during my care of the patient were reviewed by me and considered in my medical decision making (see chart for details).  A  medical screening exam was performed and I feel the patient has had an appropriate workup for their chief complaint at this time and likelihood of emergent condition existing is low. They have been counseled on decision, discharge, follow up and which symptoms necessitate immediate return to the emergency department. They or their family verbally stated understanding and agreement with plan and discharged in stable condition.   ____________________________________________  FINAL CLINICAL IMPRESSION(S) / ED DIAGNOSES  Final diagnoses:  Anal fissure  Constipation, unspecified constipation type     MEDICATIONS GIVEN DURING THIS VISIT:  Medications  dicyclomine (BENTYL) capsule 10 mg (10 mg Oral Given 07/18/18 2356)     NEW OUTPATIENT MEDICATIONS STARTED DURING THIS VISIT:  Discharge Medication List as of 07/19/2018 12:54 AM    START taking these medications   Details  dicyclomine (BENTYL) 20 MG tablet Take 1 tablet (20 mg total) by mouth 2 (two) times daily as needed for spasms (abdominal cramping)., Starting Wed 07/19/2018, Print        Note:  This note was prepared with assistance of Dragon voice recognition software. Occasional wrong-word or sound-a-like substitutions may have occurred due to the inherent limitations of voice recognition software.   Rodel Glaspy, Barbara Cower, MD 07/19/18 [phone]

## 2018-07-19 NOTE — ED Notes (Signed)
edp in room  

## 2019-09-30 ENCOUNTER — Other Ambulatory Visit: Payer: Self-pay

## 2019-09-30 ENCOUNTER — Ambulatory Visit
Admission: EM | Admit: 2019-09-30 | Discharge: 2019-09-30 | Disposition: A | Discharge status: Home / Self Care | Payer: BLUE CROSS/BLUE SHIELD | Attending: Emergency Medicine | Admitting: Emergency Medicine

## 2019-09-30 DIAGNOSIS — M545 Low back pain, unspecified: Secondary | ICD-10-CM

## 2019-09-30 MED ORDER — DEXAMETHASONE SODIUM PHOSPHATE 10 MG/ML IJ SOLN
10.0000 mg | Freq: Once | INTRAMUSCULAR | Status: AC
  Administered 2019-09-30: 10 mg via INTRAMUSCULAR

## 2019-09-30 MED ORDER — CYCLOBENZAPRINE HCL 10 MG PO TABS: 10.0000 mg | ORAL_TABLET | Freq: Every day | ORAL | 0 refills | Status: DC

## 2019-09-30 MED ORDER — PREDNISONE 10 MG (21) PO TBPK: ORAL_TABLET | Freq: Every day | ORAL | 0 refills | Status: DC

## 2019-09-30 NOTE — ED Provider Notes (Signed)
[facility]   [id number] 09/30/19 Arrival Time: 5366  CC: Back PAIN  SUBJECTIVE: History from: patient. Lauren Parks is a 39 y.o. female complains of RT low back pain x 3 days.  Denies a precipitating event or specific injury.  Localizes the pain to the RT low back.  Describes the pain as intermittent, achy and throbbing in character.  Has tried OTC medications without relief.  Symptoms are made worse with movement.  Denies similar symptoms in the past.  Denies fever, chills, erythema, ecchymosis, effusion, weakness, numbness and tingling, saddle paresthesias, loss of bowel or bladder function.     ROS: As per HPI.  All other pertinent ROS negative.     Past Medical History:  Diagnosis Date  . Arthritis    rheumatoid   . Thyroid disease    hypothyroid   Past Surgical History:  Procedure Laterality Date  . CESAREAN SECTION    . TUBAL LIGATION Bilateral 2013   Allergies  Allergen Reactions  . Morphine And Related Anaphylaxis   No current facility-administered medications on file prior to encounter.   Current Outpatient Medications on File Prior to Encounter  Medication Sig Dispense Refill  . dicyclomine (BENTYL) 20 MG tablet Take 1 tablet (20 mg total) by mouth 2 (two) times daily as needed for spasms (abdominal cramping). 20 tablet 0  . ibuprofen (ADVIL,MOTRIN) 600 MG tablet Take 1 tablet (600 mg total) by mouth every 6 (six) hours as needed. 30 tablet 0  . traMADol (ULTRAM) 50 MG tablet Take 1 tablet (50 mg total) by mouth every 6 (six) hours as needed. 12 tablet 0   Social History   Socioeconomic History  . Marital status: Married    Spouse name: Not on file  . Number of children: Not on file  . Years of education: Not on file  . Highest education level: Not on file  Occupational History  . Not on file  Tobacco Use  . Smoking status: Current Every Day Smoker    Packs/day: 0.50    Types: Cigarettes  . Smokeless tobacco: Never Used  Vaping Use  .  Vaping Use: Never used  Substance and Sexual Activity  . Alcohol use: No  . Drug use: No  . Sexual activity: Not on file  Other Topics Concern  . Not on file  Social History Narrative  . Not on file   Social Determinants of Health   Financial Resource Strain:   . Difficulty of Paying Living Expenses:   Food Insecurity:   . Worried About Programme researcher, broadcasting/film/video in the Last Year:   . Barista in the Last Year:   Transportation Needs:   . Freight forwarder (Medical):   Marland Kitchen Lack of Transportation (Non-Medical):   Physical Activity:   . Days of Exercise per Week:   . Minutes of Exercise per Session:   Stress:   . Feeling of Stress :   Social Connections:   . Frequency of Communication with Friends and Family:   . Frequency of Social Gatherings with Friends and Family:   . Attends Religious Services:   . Active Member of Clubs or Organizations:   . Attends Banker Meetings:   Marland Kitchen Marital Status:   Intimate Partner Violence:   . Fear of Current or Ex-Partner:   . Emotionally Abused:   Marland Kitchen Physically Abused:   . Sexually Abused:    Family History  Family history unknown: Yes    OBJECTIVE:  Vitals:   09/30/19 0907  BP: 122/80  Pulse: 83  Resp: 18  Temp: 98.3 F (36.8 C)  TempSrc: Oral  SpO2: 97%    General appearance: ALERT; in no acute distress.  Head: NCAT Lungs: Normal respiratory effort; CTAB CV: RRR Musculoskeletal: Back Inspection: Skin warm, dry, clear and intact without obvious erythema, effusion, or ecchymosis.  Palpation: TTP over RT low back ROM: FROM active and passive Strength: 5/5 shld abduction, 5/5 shld adduction, 5/5 elbow flexion, 5/5 elbow extension, 5/5 grip strength, 5/5 hip flexion, 5/5 hip extension Skin: warm and dry Neurologic: Ambulates without difficulty; Sensation intact about the upper/ lower extremitie Psychological: alert and cooperative; normal mood and affect   ASSESSMENT & PLAN:  1. Acute right-sided low  back pain without sciatica     Meds ordered this encounter  Medications  . predniSONE (STERAPRED UNI-PAK 21 TAB) 10 MG (21) TBPK tablet    Sig: Take by mouth daily. Take 6 tabs by mouth daily  for 2 days, then 5 tabs for 2 days, then 4 tabs for 2 days, then 3 tabs for 2 days, 2 tabs for 2 days, then 1 tab by mouth daily for 2 days    Dispense:  42 tablet    Refill:  0    Order Specific Question:   Supervising Provider    Answer:   Eustace Moore [[id number]]  . cyclobenzaprine (FLEXERIL) 10 MG tablet    Sig: Take 1 tablet (10 mg total) by mouth at bedtime.    Dispense:  15 tablet    Refill:  0    Order Specific Question:   Supervising Provider    Answer:   Eustace Moore [[id number]]  . dexamethasone (DECADRON) injection 10 mg   Steroid shot given in office Continue conservative management of rest, ice, heat and massage Prednisone prescribed.  Take as directed and to completion Take cyclobenzaprine at nighttime for symptomatic relief. Avoid driving or operating heavy machinery while using medication. Follow up with PCP if symptoms persist Return or go to the ER if you have any new or worsening symptoms (fever, chills, chest pain, abdominal pain, changes in bowel or bladder habits, pain radiating into lower legs, etc...)   Reviewed expectations re: course of current medical issues. Questions answered. Outlined signs and symptoms indicating need for more acute intervention. Patient verbalized understanding. After Visit Summary given.    Alvino Chapel McFall, PA-C 09/30/19 [phone]

## 2019-09-30 NOTE — ED Triage Notes (Signed)
Pt presents with lower back pain X 3 days; pt states she got her covid vaccine on Thursday and is unsure if that is associated.

## 2019-09-30 NOTE — Discharge Instructions (Signed)
Steroid shot given in office Continue conservative management of rest, ice, heat and massage Prednisone prescribed.  Take as directed and to completion Take cyclobenzaprine at nighttime for symptomatic relief. Avoid driving or operating heavy machinery while using medication. Follow up with PCP if symptoms persist Return or go to the ER if you have any new or worsening symptoms (fever, chills, chest pain, abdominal pain, changes in bowel or bladder habits, pain radiating into lower legs, etc...)

## 2019-10-25 ENCOUNTER — Encounter: Payer: Self-pay | Admitting: Emergency Medicine

## 2019-10-25 ENCOUNTER — Ambulatory Visit
Admission: EM | Admit: 2019-10-25 | Discharge: 2019-10-25 | Disposition: A | Discharge status: Home / Self Care | Payer: Self-pay | Attending: Emergency Medicine | Admitting: Emergency Medicine

## 2019-10-25 DIAGNOSIS — N390 Urinary tract infection, site not specified: Secondary | ICD-10-CM | POA: Insufficient documentation

## 2019-10-25 LAB — POCT URINALYSIS DIP (MANUAL ENTRY)
Bilirubin, UA: NEGATIVE
Glucose, UA: NEGATIVE mg/dL
Nitrite, UA: NEGATIVE
Protein Ur, POC: NEGATIVE mg/dL
Spec Grav, UA: >=1.03 — AB (ref 1.010–1.025)
Urobilinogen, UA: 0.2 E.U./dL
pH, UA: 5.5 (ref 5.0–8.0)

## 2019-10-25 MED ORDER — PHENAZOPYRIDINE HCL 100 MG PO TABS: 100.0000 mg | ORAL_TABLET | Freq: Three times a day (TID) | ORAL | 0 refills | Status: AC | PRN

## 2019-10-25 MED ORDER — NITROFURANTOIN MONOHYD MACRO 100 MG PO CAPS: 100.0000 mg | ORAL_CAPSULE | Freq: Two times a day (BID) | ORAL | 0 refills | Status: DC

## 2019-10-25 NOTE — ED Provider Notes (Signed)
[facility]   Chief Complaint  Patient presents with  . Urinary Tract Infection     SUBJECTIVE:  Lauren Parks is a 39 y.o. female who presented to the urgent care with a complaint of dysuria and urine frequency for the past 1 week.  Patient denies a precipitating event, recent sexual encounter, excessive caffeine intake.  Denies abdominal pain.  Has tried OTC medications without relief.  Symptoms are made worse with urination.  Admits to similar symptoms in the past.  Denies fever, chills, nausea, vomiting, abdominal pain, flank pain, abnormal vaginal discharge or bleeding, hematuria.    LMP: Patient's last menstrual period was 10/09/2019.  ROS: As in HPI.  All other pertinent ROS negative.     Past Medical History:  Diagnosis Date  . Arthritis    rheumatoid   . Thyroid disease    hypothyroid   Past Surgical History:  Procedure Laterality Date  . CESAREAN SECTION    . TUBAL LIGATION Bilateral 2013   Allergies  Allergen Reactions  . Morphine And Related Anaphylaxis   No current facility-administered medications on file prior to encounter.   Current Outpatient Medications on File Prior to Encounter  Medication Sig Dispense Refill  . cyclobenzaprine (FLEXERIL) 10 MG tablet Take 1 tablet (10 mg total) by mouth at bedtime. 15 tablet 0  . dicyclomine (BENTYL) 20 MG tablet Take 1 tablet (20 mg total) by mouth 2 (two) times daily as needed for spasms (abdominal cramping). 20 tablet 0  . ibuprofen (ADVIL,MOTRIN) 600 MG tablet Take 1 tablet (600 mg total) by mouth every 6 (six) hours as needed. 30 tablet 0  . predniSONE (STERAPRED UNI-PAK 21 TAB) 10 MG (21) TBPK tablet Take by mouth daily. Take 6 tabs by mouth daily  for 2 days, then 5 tabs for 2 days, then 4 tabs for 2 days, then 3 tabs for 2 days, 2 tabs for 2 days, then 1 tab by mouth daily for 2 days 42 tablet 0  . traMADol (ULTRAM) 50 MG tablet Take 1 tablet (50 mg total) by mouth every 6 (six) hours as needed.  12 tablet 0   Social History   Socioeconomic History  . Marital status: Married    Spouse name: Not on file  . Number of children: Not on file  . Years of education: Not on file  . Highest education level: Not on file  Occupational History  . Not on file  Tobacco Use  . Smoking status: Current Every Day Smoker    Packs/day: 0.50    Types: Cigarettes  . Smokeless tobacco: Never Used  Vaping Use  . Vaping Use: Never used  Substance and Sexual Activity  . Alcohol use: No  . Drug use: No  . Sexual activity: Not on file  Other Topics Concern  . Not on file  Social History Narrative  . Not on file   Social Determinants of Health   Financial Resource Strain:   . Difficulty of Paying Living Expenses: Not on file  Food Insecurity:   . Worried About Programme researcher, broadcasting/film/video in the Last Year: Not on file  . Ran Out of Food in the Last Year: Not on file  Transportation Needs:   . Lack of Transportation (Medical): Not on file  . Lack of Transportation (Non-Medical): Not on file  Physical Activity:   . Days of Exercise per Week: Not on file  . Minutes of Exercise per Session: Not on file  Stress:   .  Feeling of Stress : Not on file  Social Connections:   . Frequency of Communication with Friends and Family: Not on file  . Frequency of Social Gatherings with Friends and Family: Not on file  . Attends Religious Services: Not on file  . Active Member of Clubs or Organizations: Not on file  . Attends Banker Meetings: Not on file  . Marital Status: Not on file  Intimate Partner Violence:   . Fear of Current or Ex-Partner: Not on file  . Emotionally Abused: Not on file  . Physically Abused: Not on file  . Sexually Abused: Not on file   Family History  Family history unknown: Yes    OBJECTIVE:  Vitals:   10/25/19 1234  BP: 108/70  Pulse: 89  Resp: 18  Temp: 98.8 F (37.1 C)  TempSrc: Oral  SpO2: 97%   General appearance: AOx3 in no acute distress HEENT:  NCAT.  Oropharynx clear.  Lungs: clear to auscultation bilaterally without adventitious breath sounds Heart: regular rate and rhythm.  Radial pulses 2+ symmetrical bilaterally Abdomen: soft; non-distended; no tenderness; bowel sounds present; no guarding or rebound tenderness Back: no CVA tenderness Extremities: no edema; symmetrical with no gross deformities Skin: warm and dry Neurologic: Ambulates from chair to exam table without difficulty Psychological: alert and cooperative; normal mood and affect  Labs Reviewed  POCT URINALYSIS DIP (MANUAL ENTRY) - Abnormal; Notable for the following components:      Result Value   Ketones, POC UA trace (5) (*)    Spec Grav, UA >=1.030 (*)    Blood, UA moderate (*)    Leukocytes, UA Trace (*)    All other components within normal limits  URINE CULTURE    ASSESSMENT & PLAN:  1. Acute lower UTI     Meds ordered this encounter  Medications  . nitrofurantoin, macrocrystal-monohydrate, (MACROBID) 100 MG capsule    Sig: Take 1 capsule (100 mg total) by mouth 2 (two) times daily.    Dispense:  10 capsule    Refill:  0  . phenazopyridine (PYRIDIUM) 100 MG tablet    Sig: Take 1 tablet (100 mg total) by mouth 3 (three) times daily as needed for pain.    Dispense:  10 tablet    Refill:  0   Discharge instructions Urine culture sent.  We will call you with the results.   Push fluids and get plenty of rest.   Take antibiotic as directed and to completion Take pyridium as prescribed and as needed for symptomatic relief Follow up with PCP if symptoms persists Return here or go to ER if you have any new or worsening symptoms such as fever, worsening abdominal pain, nausea/vomiting, flank pain, etc...  Outlined signs and symptoms indicating need for more acute intervention. Patient verbalized understanding. After Visit Summary given.  Note: This document was prepared using Dragon voice recognition software and may include unintentional dictation  errors.    Durward Parcel, FNP 10/25/19 1303

## 2019-10-25 NOTE — Discharge Instructions (Addendum)
Urine culture sent.  We will call you with the results.   Push fluids and get plenty of rest.   Take antibiotic as directed and to completion Take pyridium as prescribed and as needed for symptomatic relief Follow up with PCP if symptoms persists Return here or go to ER if you have any new or worsening symptoms such as fever, worsening abdominal pain, nausea/vomiting, flank pain, etc... 

## 2019-10-25 NOTE — ED Triage Notes (Addendum)
Burning with urination ad urinary frequency x 1week.  Pt reports she has been taking azo but has not taken any today.

## 2019-10-27 LAB — URINE CULTURE: Special Requests: NORMAL

## 2020-01-15 ENCOUNTER — Ambulatory Visit
Admission: EM | Admit: 2020-01-15 | Discharge: 2020-01-15 | Disposition: A | Discharge status: Home / Self Care | Payer: BC Managed Care – PPO | Attending: Emergency Medicine | Admitting: Emergency Medicine

## 2020-01-15 ENCOUNTER — Other Ambulatory Visit: Payer: Self-pay

## 2020-01-15 DIAGNOSIS — R6889 Other general symptoms and signs: Secondary | ICD-10-CM

## 2020-01-15 DIAGNOSIS — Z1152 Encounter for screening for COVID-19: Secondary | ICD-10-CM

## 2020-01-15 MED ORDER — ONDANSETRON HCL 4 MG PO TABS: 4.0000 mg | ORAL_TABLET | Freq: Four times a day (QID) | ORAL | 0 refills | Status: AC

## 2020-01-15 NOTE — Discharge Instructions (Signed)
COVID testing ordered.  It will take between 5-7 days for test results.  Someone will contact you regarding abnormal results.    In the meantime: You should remain isolated in your home for 10 days from symptom onset AND greater than 72 hours after symptoms resolution (absence of fever without the use of fever-reducing medication and improvement in respiratory symptoms), whichever is longer Get plenty of rest and push fluids Zofran as needed for nausea and vomiting Use medications daily for symptom relief Use OTC medications like ibuprofen or tylenol as needed fever or pain Call or go to the ED if you have any new or worsening symptoms such as fever, cough, shortness of breath, chest tightness, chest pain, turning blue, changes in mental status, etc..Marland Kitchen

## 2020-01-15 NOTE — ED Triage Notes (Signed)
Pt presents with body aches and chills that began sunday after vomiting on Saturday

## 2020-01-15 NOTE — ED Provider Notes (Signed)
[facility]   [id number] 01/15/20 Arrival Time: 1112   CC: flu like symptoms  SUBJECTIVE: History from: patient.  Lauren Parks is a 39 y.o. female who presents with vomiting, body aches, and chills x 3 days.  Son with a "24 hour bug."  Denies alleviating or aggravating factors.  Denies previous symptoms in the past.   Denies sinus pain, rhinorrhea, cough, sore throat, SOB, wheezing, chest pain, changes in bowel or bladder habits.     ROS: As per HPI.  All other pertinent ROS negative.     Past Medical History:  Diagnosis Date   Arthritis    rheumatoid    Thyroid disease    hypothyroid   Past Surgical History:  Procedure Laterality Date   CESAREAN SECTION     TUBAL LIGATION Bilateral 2013   Allergies  Allergen Reactions   Morphine And Related Anaphylaxis   No current facility-administered medications on file prior to encounter.   Current Outpatient Medications on File Prior to Encounter  Medication Sig Dispense Refill   dicyclomine (BENTYL) 20 MG tablet Take 1 tablet (20 mg total) by mouth 2 (two) times daily as needed for spasms (abdominal cramping). 20 tablet 0   ibuprofen (ADVIL,MOTRIN) 600 MG tablet Take 1 tablet (600 mg total) by mouth every 6 (six) hours as needed. 30 tablet 0   phenazopyridine (PYRIDIUM) 100 MG tablet Take 1 tablet (100 mg total) by mouth 3 (three) times daily as needed for pain. 10 tablet 0   traMADol (ULTRAM) 50 MG tablet Take 1 tablet (50 mg total) by mouth every 6 (six) hours as needed. 12 tablet 0   Social History   Socioeconomic History   Marital status: Married    Spouse name: Not on file   Number of children: Not on file   Years of education: Not on file   Highest education level: Not on file  Occupational History   Not on file  Tobacco Use   Smoking status: Current Every Day Smoker    Packs/day: 0.50    Types: Cigarettes   Smokeless tobacco: Never Used  Vaping Use   Vaping Use: Never used    Substance and Sexual Activity   Alcohol use: No   Drug use: No   Sexual activity: Not on file  Other Topics Concern   Not on file  Social History Narrative   Not on file   Social Determinants of Health   Financial Resource Strain:    Difficulty of Paying Living Expenses: Not on file  Food Insecurity:    Worried About Running Out of Food in the Last Year: Not on file   Ran Out of Food in the Last Year: Not on file  Transportation Needs:    Lack of Transportation (Medical): Not on file   Lack of Transportation (Non-Medical): Not on file  Physical Activity:    Days of Exercise per Week: Not on file   Minutes of Exercise per Session: Not on file  Stress:    Feeling of Stress : Not on file  Social Connections:    Frequency of Communication with Friends and Family: Not on file   Frequency of Social Gatherings with Friends and Family: Not on file   Attends Religious Services: Not on file   Active Member of Clubs or Organizations: Not on file   Attends Banker Meetings: Not on file   Marital Status: Not on file  Intimate Partner Violence:    Fear of Current or  Ex-Partner: Not on file   Emotionally Abused: Not on file   Physically Abused: Not on file   Sexually Abused: Not on file   Family History  Family history unknown: Yes    OBJECTIVE:  Vitals:   01/15/20 1137  BP: 135/90  Pulse: (!) 110  Resp: 20  Temp: (!) 101.7 F (38.7 C)  SpO2: 96%     General appearance: alert; appears fatigued, but nontoxic; speaking in full sentences and tolerating own secretions HEENT: NCAT; Ears: EACs clear, TMs pearly gray; Eyes: PERRL.  EOM grossly intact. Nose: nares patent without rhinorrhea, Throat: oropharynx clear, tonsils non erythematous or enlarged, uvula midline  Neck: supple without LAD Lungs: unlabored respirations, symmetrical air entry; cough: absent; no respiratory distress; CTAB Heart: regular rate and rhythm.  Skin: warm and  dry Psychological: alert and cooperative; normal mood and affect   ASSESSMENT & PLAN:  1. Encounter for screening for COVID-19   2. Flu-like symptoms     Meds ordered this encounter  Medications   ondansetron (ZOFRAN) 4 MG tablet    Sig: Take 1 tablet (4 mg total) by mouth every 6 (six) hours.    Dispense:  12 tablet    Refill:  0    Order Specific Question:   Supervising Provider    Answer:   Eustace Moore [[id number]]   COVID testing ordered.  It will take between 5-7 days for test results.  Someone will contact you regarding abnormal results.    In the meantime: You should remain isolated in your home for 10 days from symptom onset AND greater than 72 hours after symptoms resolution (absence of fever without the use of fever-reducing medication and improvement in respiratory symptoms), whichever is longer Get plenty of rest and push fluids Zofran as needed for nausea and vomiting Use medications daily for symptom relief Use OTC medications like ibuprofen or tylenol as needed fever or pain Call or go to the ED if you have any new or worsening symptoms such as fever, cough, shortness of breath, chest tightness, chest pain, turning blue, changes in mental status, etc...   Reviewed expectations re: course of current medical issues. Questions answered. Outlined signs and symptoms indicating need for more acute intervention. Patient verbalized understanding. After Visit Summary given.         Rennis Harding, PA-C 01/15/20 1207

## 2020-01-17 LAB — COVID-19, FLU A+B AND RSV
Influenza A, NAA: NOT DETECTED
Influenza B, NAA: NOT DETECTED
RSV, NAA: NOT DETECTED
SARS-CoV-2, NAA: NOT DETECTED

## 2023-01-19 ENCOUNTER — Other Ambulatory Visit: Payer: Self-pay | Admitting: Family Medicine

## 2023-01-19 DIAGNOSIS — Z1231 Encounter for screening mammogram for malignant neoplasm of breast: Secondary | ICD-10-CM

## 2023-10-17 ENCOUNTER — Encounter

## 2023-10-17 ENCOUNTER — Other Ambulatory Visit (HOSPITAL_COMMUNITY): Payer: Self-pay | Admitting: Family Medicine

## 2023-10-17 DIAGNOSIS — N938 Other specified abnormal uterine and vaginal bleeding: Secondary | ICD-10-CM

## 2023-10-25 ENCOUNTER — Ambulatory Visit (HOSPITAL_COMMUNITY)

## 2023-11-04 ENCOUNTER — Ambulatory Visit (HOSPITAL_COMMUNITY)
Admission: RE | Admit: 2023-11-04 | Discharge: 2023-11-04 | Disposition: A | Discharge status: Home / Self Care | Source: Ambulatory Visit | Attending: Family Medicine | Admitting: Family Medicine

## 2023-11-04 ENCOUNTER — Ambulatory Visit
Admission: RE | Admit: 2023-11-04 | Discharge: 2023-11-04 | Disposition: A | Discharge status: Home / Self Care | Source: Ambulatory Visit | Attending: Family Medicine | Admitting: Family Medicine

## 2023-11-04 DIAGNOSIS — N938 Other specified abnormal uterine and vaginal bleeding: Secondary | ICD-10-CM | POA: Diagnosis present

## 2023-11-04 DIAGNOSIS — Z1231 Encounter for screening mammogram for malignant neoplasm of breast: Secondary | ICD-10-CM | POA: Diagnosis present
